# Patient Record
Sex: Male | Born: 1980 | Race: White | Hispanic: No | Marital: Single | Smoking: Current some day smoker
Health system: Southern US, Community
[De-identification: ages and names within clinical notes are randomized; demographics above are authoritative.]

---

## 2004-07-27 ENCOUNTER — Emergency Department: Payer: Self-pay | Admitting: Emergency Medicine

## 2004-07-31 ENCOUNTER — Ambulatory Visit: Payer: Self-pay | Admitting: Orthopaedic Surgery

## 2005-06-13 ENCOUNTER — Emergency Department: Payer: Self-pay | Admitting: Emergency Medicine

## 2005-12-17 ENCOUNTER — Emergency Department: Payer: Self-pay | Admitting: Emergency Medicine

## 2007-03-27 ENCOUNTER — Emergency Department: Payer: Self-pay | Admitting: Internal Medicine

## 2015-06-20 ENCOUNTER — Emergency Department
Admission: EM | Admit: 2015-06-20 | Discharge: 2015-06-20 | Disposition: A | Discharge status: Home / Self Care | Payer: Self-pay | Attending: Emergency Medicine | Admitting: Emergency Medicine

## 2015-06-20 ENCOUNTER — Encounter: Payer: Self-pay | Admitting: *Deleted

## 2015-06-20 ENCOUNTER — Emergency Department: Payer: Self-pay

## 2015-06-20 DIAGNOSIS — S0003XA Contusion of scalp, initial encounter: Secondary | ICD-10-CM

## 2015-06-20 DIAGNOSIS — Y9389 Activity, other specified: Secondary | ICD-10-CM | POA: Insufficient documentation

## 2015-06-20 DIAGNOSIS — Y998 Other external cause status: Secondary | ICD-10-CM | POA: Insufficient documentation

## 2015-06-20 DIAGNOSIS — Z720 Tobacco use: Secondary | ICD-10-CM | POA: Insufficient documentation

## 2015-06-20 DIAGNOSIS — Y9289 Other specified places as the place of occurrence of the external cause: Secondary | ICD-10-CM | POA: Insufficient documentation

## 2015-06-20 DIAGNOSIS — S0081XA Abrasion of other part of head, initial encounter: Secondary | ICD-10-CM | POA: Insufficient documentation

## 2015-06-20 DIAGNOSIS — S0083XA Contusion of other part of head, initial encounter: Secondary | ICD-10-CM | POA: Insufficient documentation

## 2015-06-20 LAB — COMPREHENSIVE METABOLIC PANEL
ALBUMIN: 4.4 g/dL (ref 3.5–5.0)
ALT: 65 U/L — ABNORMAL HIGH (ref 17–63)
AST: 88 U/L — AB (ref 15–41)
Alkaline Phosphatase: 58 U/L (ref 38–126)
Anion gap: 7 (ref 5–15)
BUN: 6 mg/dL (ref 6–20)
CHLORIDE: 111 mmol/L (ref 101–111)
CO2: 25 mmol/L (ref 22–32)
Calcium: 8.6 mg/dL — ABNORMAL LOW (ref 8.9–10.3)
Creatinine, Ser: 0.87 mg/dL (ref 0.61–1.24)
GFR calc Af Amer: >60 mL/min (ref >60)
GFR calc non Af Amer: >60 mL/min (ref >60)
GLUCOSE: 107 mg/dL — AB (ref 65–99)
POTASSIUM: 3.4 mmol/L — AB (ref 3.5–5.1)
Sodium: 143 mmol/L (ref 135–145)
Total Bilirubin: 0.7 mg/dL (ref 0.3–1.2)
Total Protein: 7.8 g/dL (ref 6.5–8.1)

## 2015-06-20 LAB — CBC WITH DIFFERENTIAL/PLATELET
BASOS ABS: 0 10*3/uL (ref 0–0.1)
BASOS PCT: 0 %
EOS PCT: 0 %
Eosinophils Absolute: 0 10*3/uL (ref 0–0.7)
HCT: 41 % (ref 40.0–52.0)
Hemoglobin: 14 g/dL (ref 13.0–18.0)
Lymphocytes Relative: 30 %
Lymphs Abs: 3.3 10*3/uL (ref 1.0–3.6)
MCH: 32.5 pg (ref 26.0–34.0)
MCHC: 34.1 g/dL (ref 32.0–36.0)
MCV: 95.4 fL (ref 80.0–100.0)
MONO ABS: 1.1 10*3/uL — AB (ref 0.2–1.0)
Monocytes Relative: 10 %
Neutro Abs: 6.4 10*3/uL (ref 1.4–6.5)
Neutrophils Relative %: 60 %
PLATELETS: 187 10*3/uL (ref 150–440)
RBC: 4.3 MIL/uL — ABNORMAL LOW (ref 4.40–5.90)
RDW: 13.7 % (ref 11.5–14.5)
WBC: 10.8 10*3/uL — ABNORMAL HIGH (ref 3.8–10.6)

## 2015-06-20 LAB — ETHANOL: ALCOHOL ETHYL (B): 236 mg/dL — AB (ref <5)

## 2015-06-20 MED ORDER — SODIUM CHLORIDE 0.9 % IV BOLUS (SEPSIS)
1000.0000 mL | Freq: Once | INTRAVENOUS | Status: AC
  Administered 2015-06-20: 1000 mL via INTRAVENOUS

## 2015-06-20 NOTE — ED Notes (Signed)
Officers remain with patient. Pt here for medical clearance for jail.
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
## 2015-06-20 NOTE — ED Notes (Signed)
Patient transported to CT via stretcher.
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
## 2015-06-20 NOTE — ED Notes (Signed)
MD at bedside for eval.
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
